# Patient Record
Sex: Male | Born: 1955 | Race: Black or African American | Hispanic: No | Marital: Married | Smoking: Never smoker
Health system: Southern US, Community
[De-identification: ages and names within clinical notes are randomized; demographics above are authoritative.]

## PROBLEM LIST (undated history)

## (undated) DIAGNOSIS — E782 Mixed hyperlipidemia: Secondary | ICD-10-CM

## (undated) DIAGNOSIS — G4733 Obstructive sleep apnea (adult) (pediatric): Secondary | ICD-10-CM

## (undated) DIAGNOSIS — B009 Herpesviral infection, unspecified: Secondary | ICD-10-CM

## (undated) DIAGNOSIS — M10372 Gout due to renal impairment, left ankle and foot: Secondary | ICD-10-CM

## (undated) DIAGNOSIS — R7302 Impaired glucose tolerance (oral): Secondary | ICD-10-CM

## (undated) DIAGNOSIS — C61 Malignant neoplasm of prostate: Secondary | ICD-10-CM

## (undated) DIAGNOSIS — I10 Essential (primary) hypertension: Secondary | ICD-10-CM

## (undated) HISTORY — DX: Essential (primary) hypertension: I10

## (undated) HISTORY — DX: Mixed hyperlipidemia: E78.2

## (undated) HISTORY — DX: Gout due to renal impairment, left ankle and foot: M10.372

## (undated) HISTORY — DX: Herpesviral infection, unspecified: B00.9

## (undated) HISTORY — DX: Malignant neoplasm of prostate: C61

## (undated) HISTORY — DX: Obstructive sleep apnea (adult) (pediatric): G47.33

## (undated) HISTORY — DX: Impaired glucose tolerance (oral): R73.02

---

## 2006-02-04 ENCOUNTER — Emergency Department: Payer: Self-pay | Admitting: Emergency Medicine

## 2007-05-25 ENCOUNTER — Ambulatory Visit: Payer: Self-pay | Admitting: Urology

## 2007-06-01 ENCOUNTER — Ambulatory Visit: Payer: Self-pay | Admitting: Urology

## 2009-08-19 ENCOUNTER — Emergency Department: Payer: Self-pay | Admitting: Emergency Medicine

## 2009-09-14 ENCOUNTER — Ambulatory Visit: Payer: Self-pay | Admitting: Family Medicine

## 2011-08-04 ENCOUNTER — Emergency Department: Payer: Self-pay | Admitting: Emergency Medicine

## 2011-09-02 ENCOUNTER — Emergency Department: Payer: Self-pay | Admitting: Emergency Medicine

## 2014-09-22 ENCOUNTER — Emergency Department: Payer: Self-pay | Admitting: Emergency Medicine

## 2018-02-09 ENCOUNTER — Emergency Department: Payer: Self-pay

## 2018-02-09 ENCOUNTER — Emergency Department
Admission: EM | Admit: 2018-02-09 | Discharge: 2018-02-09 | Disposition: A | Discharge status: Home / Self Care | Payer: Self-pay | Attending: Emergency Medicine | Admitting: Emergency Medicine

## 2018-02-09 ENCOUNTER — Encounter: Payer: Self-pay | Admitting: Emergency Medicine

## 2018-02-09 ENCOUNTER — Other Ambulatory Visit: Payer: Self-pay

## 2018-02-09 DIAGNOSIS — H9312 Tinnitus, left ear: Secondary | ICD-10-CM | POA: Insufficient documentation

## 2018-02-09 DIAGNOSIS — I10 Essential (primary) hypertension: Secondary | ICD-10-CM | POA: Insufficient documentation

## 2018-02-09 DIAGNOSIS — R42 Dizziness and giddiness: Secondary | ICD-10-CM | POA: Insufficient documentation

## 2018-02-09 DIAGNOSIS — H9319 Tinnitus, unspecified ear: Secondary | ICD-10-CM

## 2018-02-09 HISTORY — DX: Essential (primary) hypertension: I10

## 2018-02-09 LAB — URINALYSIS, COMPLETE (UACMP) WITH MICROSCOPIC
Bacteria, UA: NONE SEEN
Bilirubin Urine: NEGATIVE
GLUCOSE, UA: NEGATIVE mg/dL
HGB URINE DIPSTICK: NEGATIVE
Ketones, ur: NEGATIVE mg/dL
Leukocytes, UA: NEGATIVE
Nitrite: NEGATIVE
PH: 6 (ref 5.0–8.0)
Protein, ur: NEGATIVE mg/dL
SPECIFIC GRAVITY, URINE: 1.01 (ref 1.005–1.030)

## 2018-02-09 LAB — BASIC METABOLIC PANEL
Anion gap: 9 (ref 5–15)
BUN: 13 mg/dL (ref 8–23)
CALCIUM: 9 mg/dL (ref 8.9–10.3)
CO2: 28 mmol/L (ref 22–32)
CREATININE: 1.1 mg/dL (ref 0.61–1.24)
Chloride: 102 mmol/L (ref 98–111)
GFR calc non Af Amer: >60 mL/min (ref >60)
Glucose, Bld: 133 mg/dL — ABNORMAL HIGH (ref 70–99)
Potassium: 4.4 mmol/L (ref 3.5–5.1)
SODIUM: 139 mmol/L (ref 135–145)

## 2018-02-09 LAB — CBC
HCT: 45 % (ref 40.0–52.0)
Hemoglobin: 15.6 g/dL (ref 13.0–18.0)
MCH: 29.9 pg (ref 26.0–34.0)
MCHC: 34.6 g/dL (ref 32.0–36.0)
MCV: 86.4 fL (ref 80.0–100.0)
PLATELETS: 237 10*3/uL (ref 150–440)
RBC: 5.2 MIL/uL (ref 4.40–5.90)
RDW: 14.3 % (ref 11.5–14.5)
WBC: 7.6 10*3/uL (ref 3.8–10.6)

## 2018-02-09 LAB — TROPONIN I

## 2018-02-09 MED ORDER — BUTALBITAL-APAP-CAFFEINE 50-325-40 MG PO TABS
1.0000 | ORAL_TABLET | Freq: Four times a day (QID) | ORAL | 0 refills | Status: AC | PRN
Start: 2018-02-09 — End: 2019-02-09

## 2018-02-09 MED ORDER — SODIUM CHLORIDE 0.9 % IV SOLN
1000.0000 mL | Freq: Once | INTRAVENOUS | Status: AC
  Administered 2018-02-09: 1000 mL via INTRAVENOUS

## 2018-02-09 MED ORDER — ONDANSETRON HCL 4 MG/2ML IJ SOLN
4.0000 mg | Freq: Once | INTRAMUSCULAR | Status: DC
  Filled 2018-02-09: qty 2

## 2018-02-09 NOTE — ED Triage Notes (Addendum)
Pt arrived via POV with reports dizziness, ear ringing and difficulty ambulating, States sxs started last Wednesday, pt states the sxs are worse today.    Pt c/o headache that started last Wednesday. Pt also states his left arm has been tingling off and on since Wednesday as well.

## 2018-02-09 NOTE — ED Provider Notes (Signed)
[facility] Emergency Department Provider Note   ____________________________________________    I have reviewed the triage vital signs and the nursing notes.   HISTORY  Chief Complaint Dizziness; Tinnitus; and Headache     HPI Larry L Bondy Sr. is a 62 y.o. male who presents with multiple complaints.  Patient reports for the last 4 to 5 days he has had a global moderate headache, left-sided tinnitus, some "swimmy headedness "when he stands up.  He denies neuro deficits.  No fevers or chills but does report waking up having sweated through his shirt this morning.  Denies cough or shortness of breath.  No recent travel.  No calf pain or swelling.  No visual changes   Past Medical History:  Diagnosis Date  . Hypertension     There are no active problems to display for this patient.   History reviewed. No pertinent surgical history.  Prior to Admission medications   Medication Sig Start Date End Date Taking? Authorizing Provider  butalbital-acetaminophen-caffeine (FIORICET, ESGIC) [phone] MG tablet Take 1-2 tablets by mouth every 6 (six) hours as needed for headache. 02/09/18 02/09/19  Lavonia Drafts, MD     Allergies Patient has no known allergies.  No family history on file.  Social History Social History   Tobacco Use  . Smoking status: Never Smoker  . Smokeless tobacco: Never Used  Substance Use Topics  . Alcohol use: Not on file  . Drug use: Not on file    Review of Systems  Constitutional: As above Eyes: No visual changes.  ENT: No sore throat. Cardiovascular: Denies chest pain. Respiratory: Denies shortness of breath. Gastrointestinal: No abdominal pain.  No nausea, no vomiting.   Genitourinary: Negative for dysuria. Musculoskeletal: Negative for back pain. Skin: Negative for rash. Neurological: As above   ____________________________________________   PHYSICAL EXAM:  VITAL SIGNS: ED Triage Vitals  Enc Vitals Group      BP 02/09/18 0948 (!) 144/97     Pulse Rate 02/09/18 0948 76     Resp 02/09/18 0948 16     Temp 02/09/18 0948 99.2 F (37.3 C)     Temp Source 02/09/18 0948 Oral     SpO2 02/09/18 0948 96 %     Weight 02/09/18 0948 126.1 kg (278 lb)     Height 02/09/18 0948 1.676 m (5\' 6" )     Head Circumference --      Peak Flow --      Pain Score 02/09/18 1004 8     Pain Loc --      Pain Edu? --      Excl. in [place]? --     Constitutional: Alert and oriented. No acute distress.  Eyes: Conjunctivae are normal.  PERRLA Head: Atraumatic. Nose: No congestion/rhinnorhea. Mouth/Throat: Mucous membranes are moist.   Ears: Normal exam Cardiovascular: Normal rate, regular rhythm. Grossly normal heart sounds.  Good peripheral circulation. Respiratory: Normal respiratory effort.  No retractions.  Gastrointestinal: Soft and nontender. No distention.    Musculoskeletal: No lower extremity tenderness nor edema.  Warm and well perfused Neurologic:  Normal speech and language. No gross focal neurologic deficits are appreciated.  Skin:  Skin is warm, dry and intact. No rash noted. Psychiatric: Mood and affect are normal. Speech and behavior are normal.  ____________________________________________   LABS (all labs ordered are listed, but only abnormal results are displayed)  Labs Reviewed  BASIC METABOLIC PANEL - Abnormal; Notable for the following components:  Result Value   Glucose, Bld 133 (*)    All other components within normal limits  URINALYSIS, COMPLETE (UACMP) WITH MICROSCOPIC - Abnormal; Notable for the following components:   Color, Urine YELLOW (*)    APPearance CLEAR (*)    All other components within normal limits  CBC  TROPONIN I  CBG MONITORING, ED   ____________________________________________  EKG  ED ECG REPORT I, Lavonia Drafts, the attending physician, personally viewed and interpreted this ECG.  Date: 02/09/2018  Rhythm: normal sinus rhythm QRS Axis:  normal Intervals: normal ST/T Wave abnormalities: normal Narrative Interpretation: no evidence of acute ischemia  ____________________________________________  RADIOLOGY  CT head and chest x-ray unremarkable ____________________________________________   PROCEDURES  Procedure(s) performed: No  Procedures   Critical Care performed:No ____________________________________________   INITIAL IMPRESSION / ASSESSMENT AND PLAN / ED COURSE  Pertinent labs & imaging results that were available during my care of the patient were reviewed by me and considered in my medical decision making (see chart for details).  Patient presents with headache x4 to 5 days, with left-sided tinnitus, some vertiginous-like symptoms.  We will treat with IV fluids, IV Zofran, obtain CT imaging, chest x-ray given description of soaked T-shirt this morning  Patient's lab work is unremarkable, imaging is normal.  He felt significant better after IV fluids and IV Zofran.  Headache has resolved.  Will discharge with outpatient follow-up, Fioricet as needed.  Return precautions discussed      ____________________________________________   FINAL CLINICAL IMPRESSION(S) / ED DIAGNOSES  Final diagnoses:  Dizziness  Tinnitus, unspecified laterality        Note:  This document was prepared using Dragon voice recognition software and may include unintentional dictation errors.      Lavonia Drafts, MD 02/09/18 [phone]

## 2018-02-09 NOTE — ED Notes (Addendum)
First Nurse Note: Patient complaining of dizziness X 2 days and ringing in left ear. Placed in [place].

## 2018-02-09 NOTE — ED Notes (Signed)
Dr. Corky Downs aware of pt BP and states to continue with discharge at this time.

## 2018-03-11 ENCOUNTER — Emergency Department
Admission: EM | Admit: 2018-03-11 | Discharge: 2018-03-11 | Disposition: A | Discharge status: Home / Self Care | Payer: Self-pay | Attending: Emergency Medicine | Admitting: Emergency Medicine

## 2018-03-11 ENCOUNTER — Other Ambulatory Visit: Payer: Self-pay

## 2018-03-11 DIAGNOSIS — I10 Essential (primary) hypertension: Secondary | ICD-10-CM | POA: Insufficient documentation

## 2018-03-11 DIAGNOSIS — L02412 Cutaneous abscess of left axilla: Secondary | ICD-10-CM | POA: Insufficient documentation

## 2018-03-11 MED ORDER — HYDROCODONE-ACETAMINOPHEN 5-325 MG PO TABS: 1.0000 | ORAL_TABLET | Freq: Four times a day (QID) | ORAL | 0 refills | Status: DC | PRN

## 2018-03-11 MED ORDER — SULFAMETHOXAZOLE-TRIMETHOPRIM 800-160 MG PO TABS: 1.0000 | ORAL_TABLET | Freq: Two times a day (BID) | ORAL | 0 refills | Status: DC

## 2018-03-11 MED ORDER — LIDOCAINE HCL (PF) 1 % IJ SOLN
5.0000 mL | Freq: Once | INTRAMUSCULAR | Status: AC
  Administered 2018-03-11: 5 mL
  Filled 2018-03-11: qty 5

## 2018-03-11 NOTE — Discharge Instructions (Signed)
Return to the emergency department in 2 days for packing removal.  Begin taking antibiotics as directed twice a day for the next 10 days.  Norco every 6 hours as needed for moderate pain.  Do not drive or operate machinery while taking this medication as it could cause drowsiness.  You should then follow-up with your primary care provider at [facility] if any continued problems.

## 2018-03-11 NOTE — ED Notes (Signed)
See triage note.

## 2018-03-11 NOTE — ED Provider Notes (Signed)
[facility] Emergency Department Provider Note  ____________________________________________   None    (approximate)  I have reviewed the triage vital signs and the nursing notes.   HISTORY  Chief Complaint Abscess   HPI Larry L Shenberger Sr. is a 62 y.o. male to the emergency department with complaint of abscess to his left arm.  Patient states that abscess started approximately 3 days ago.  He is used home remedies without any relief.  Area has not drained any.  He is unaware of any fever or chills.  He states he has had abscesses in the past.  Currently rates his pain as 10/10.  Past Medical History:  Diagnosis Date  . Hypertension     There are no active problems to display for this patient.   History reviewed. No pertinent surgical history.  Prior to Admission medications   Medication Sig Start Date End Date Taking? Authorizing Provider  butalbital-acetaminophen-caffeine (FIORICET, ESGIC) [phone] MG tablet Take 1-2 tablets by mouth every 6 (six) hours as needed for headache. 02/09/18 02/09/19  Lavonia Drafts, MD  HYDROcodone-acetaminophen (NORCO/VICODIN) 5-325 MG tablet Take 1 tablet by mouth every 6 (six) hours as needed for moderate pain. 03/11/18   Johnn Hai, PA-C  sulfamethoxazole-trimethoprim (BACTRIM DS,SEPTRA DS) 800-160 MG tablet Take 1 tablet by mouth 2 (two) times daily. 03/11/18   Johnn Hai, PA-C    Allergies Patient has no known allergies.  No family history on file.  Social History Social History   Tobacco Use  . Smoking status: Never Smoker  . Smokeless tobacco: Never Used  Substance Use Topics  . Alcohol use: Never    Frequency: Never  . Drug use: Never    Review of Systems Constitutional: No fever/chills Cardiovascular: Denies chest pain. Respiratory: Denies shortness of breath. Musculoskeletal: Negative for muscle skeletal pain. Skin: Positive for abscess. Neurological: Negative for headaches, focal  weakness or numbness. ____________________________________________   PHYSICAL EXAM:  VITAL SIGNS: ED Triage Vitals [03/11/18 0825]  Enc Vitals Group     BP (!) 153/82     Pulse Rate 68     Resp 18     Temp 98.4 F (36.9 C)     Temp Source Oral     SpO2 97 %     Weight 280 lb (127 kg)     Height 5\' 6"  (1.676 m)     Head Circumference      Peak Flow      Pain Score 10     Pain Loc      Pain Edu?      Excl. in [place]?    Constitutional: Alert and oriented. Well appearing and in no acute distress. Eyes: Conjunctivae are normal.  Head: Atraumatic. Neck: No stridor.   Cardiovascular: Normal rate, regular rhythm. Grossly normal heart sounds.  Good peripheral circulation. Respiratory: Normal respiratory effort.  No retractions. Lungs CTAB. Musculoskeletal: Moves upper and lower extremities without any difficulty however movement of the left arm is guarded secondary to pain.  Abscess is present left arm proximal aspect.  Erythematous and moderate tenderness to palpation.  No surrounding cellulitis is present. Neurologic:  Normal speech and language. No gross focal neurologic deficits are appreciated. No gait instability. Skin:  Skin is warm, dry and intact.  Erythematous left axilla area as noted above. Psychiatric: Mood and affect are normal. Speech and behavior are normal.  ____________________________________________   LABS (all labs ordered are listed, but only abnormal results are displayed)  Labs Reviewed -  No data to display ________________________________________   PROCEDURES  Procedure(s) performed:   Marland KitchenMarland KitchenIncision and Drainage Date/Time: 03/11/2018 11:06 AM Performed by: Johnn Hai, PA-C Authorized by: Johnn Hai, PA-C   Consent:    Consent obtained:  Verbal   Consent given by:  Patient   Risks discussed:  Incomplete drainage Location:    Type:  Abscess   Location:  Upper extremity   Upper extremity location:  Arm   Arm location:  L upper  arm Pre-procedure details:    Skin preparation:  Antiseptic wash Anesthesia (see MAR for exact dosages):    Anesthesia method:  Local infiltration   Local anesthetic:  Lidocaine 1% w/o epi Procedure type:    Complexity:  Simple Procedure details:    Needle aspiration: no     Incision types:  Stab incision   Scalpel blade:  11   Wound management:  Probed and deloculated   Drainage:  Purulent and bloody   Drainage amount:  Copious   Wound treatment:  Drain placed   Packing materials:  1/2 in iodoform gauze Post-procedure details:    Patient tolerance of procedure:  Tolerated well, no immediate complications    Critical Care performed: No  ____________________________________________   INITIAL IMPRESSION / ASSESSMENT AND PLAN / ED COURSE  As part of my medical decision making, I reviewed the following data within the electronic MEDICAL RECORD NUMBER Notes from prior ED visits and [place] Controlled Substance Database  Presents to the emergency department with complaint of abscess to the left upper arm.  Area was opened and packed with iodoform gauze.  He tolerated the procedure well.  He is to begin taking Bactrim DS twice daily for 10 days.  Is also given a prescription for Norco as needed for pain.  He is aware that he cannot drive or operate machinery while taking this medication.  He is to return to the emergency department in 2 days for packing removal.  He will follow-up with his PCP if any continued problems. ____________________________________________   FINAL CLINICAL IMPRESSION(S) / ED DIAGNOSES  Final diagnoses:  Abscess of left axilla     ED Discharge Orders         Ordered    sulfamethoxazole-trimethoprim (BACTRIM DS,SEPTRA DS) 800-160 MG tablet  2 times daily     03/11/18 1055    HYDROcodone-acetaminophen (NORCO/VICODIN) 5-325 MG tablet  Every 6 hours PRN     03/11/18 1055           Note:  This document was prepared using Dragon voice recognition software and  may include unintentional dictation errors.    Johnn Hai, PA-C 03/11/18 [place], [place], MD 03/11/18 [phone]

## 2018-03-11 NOTE — ED Notes (Signed)
Dressing applied to I&D abscess on left under arm

## 2018-03-11 NOTE — ED Triage Notes (Signed)
Pt has abscess located under left arm - the abscess has been present for 2-3 days with no relief from home remedies - area is not draining per pt

## 2018-03-13 ENCOUNTER — Other Ambulatory Visit: Payer: Self-pay

## 2018-03-13 ENCOUNTER — Emergency Department
Admission: EM | Admit: 2018-03-13 | Discharge: 2018-03-13 | Disposition: A | Discharge status: Home / Self Care | Payer: Self-pay | Attending: Emergency Medicine | Admitting: Emergency Medicine

## 2018-03-13 DIAGNOSIS — Z5189 Encounter for other specified aftercare: Secondary | ICD-10-CM

## 2018-03-13 DIAGNOSIS — I10 Essential (primary) hypertension: Secondary | ICD-10-CM | POA: Insufficient documentation

## 2018-03-13 DIAGNOSIS — L02412 Cutaneous abscess of left axilla: Secondary | ICD-10-CM | POA: Insufficient documentation

## 2018-03-13 DIAGNOSIS — Z79899 Other long term (current) drug therapy: Secondary | ICD-10-CM | POA: Insufficient documentation

## 2018-03-13 NOTE — ED Provider Notes (Signed)
[facility] EMERGENCY DEPARTMENT Provider Note   CSN: [id number] Arrival date & time: 03/13/18  1312     History   Chief Complaint Chief Complaint  Patient presents with  . Wound Check    HPI Larry L Beel Sr. is a 62 y.o. male presents to the emergency department for recheck of his left axilla abscess.  Patient began developing abscess with pain and swelling on 03/08/2018.  Patient was seen in the emergency department had incision and drainage of left arm axillary region abscess on 03/11/2018.  Purulent drainage was expressed, iodoform packing was applied.  Today patient states he is doing much better.  He has been taking Bactrim.  Denies any fevers.  Pain and swelling is 75% better.  No numbness or tingling throughout the left upper extremity. HPI  Past Medical History:  Diagnosis Date  . Hypertension     There are no active problems to display for this patient.   History reviewed. No pertinent surgical history.      Home Medications    Prior to Admission medications   Medication Sig Start Date End Date Taking? Authorizing Provider  butalbital-acetaminophen-caffeine (FIORICET, ESGIC) [phone] MG tablet Take 1-2 tablets by mouth every 6 (six) hours as needed for headache. 02/09/18 02/09/19  Lavonia Drafts, MD  HYDROcodone-acetaminophen (NORCO/VICODIN) 5-325 MG tablet Take 1 tablet by mouth every 6 (six) hours as needed for moderate pain. 03/11/18   Johnn Hai, PA-C  sulfamethoxazole-trimethoprim (BACTRIM DS,SEPTRA DS) 800-160 MG tablet Take 1 tablet by mouth 2 (two) times daily. 03/11/18   Johnn Hai, PA-C    Family History No family history on file.  Social History Social History   Tobacco Use  . Smoking status: Never Smoker  . Smokeless tobacco: Never Used  Substance Use Topics  . Alcohol use: Never    Frequency: Never  . Drug use: Never     Allergies   Patient has no known allergies.   Review of Systems Review of Systems    Constitutional: Negative for chills and fever.  Musculoskeletal: Negative for arthralgias, joint swelling, myalgias and neck pain.  Skin: Positive for wound. Negative for color change and rash.  Neurological: Negative for numbness.     Physical Exam Updated Vital Signs BP (!) 152/80 (BP Location: Right Arm)   Pulse 68   Temp 98.6 F (37 C) (Oral)   Resp 16   Ht 5\' 6"  (1.676 m)   Wt 126 kg   SpO2 97%   BMI 44.83 kg/m   Physical Exam  Constitutional: He is oriented to person, place, and time. He appears well-developed and well-nourished.  HENT:  Head: Normocephalic and atraumatic.  Eyes: Conjunctivae are normal.  Neck: Normal range of motion.  Cardiovascular: Normal rate.  Pulmonary/Chest: Effort normal. No respiratory distress.  Musculoskeletal: Normal range of motion.  Left inner aspect of the proximal humerus along the axillary region shows a 4 x 4 centimeter area of minimal soft tissue swelling with a 1 cm opening with iodoform packing present.  Iodoform packing removed with minimal purulent drainage still present.  There is no significant induration, cellulitis present.   Neurological: He is alert and oriented to person, place, and time.  Skin: Skin is warm. No rash noted.  Psychiatric: He has a normal mood and affect. His behavior is normal. Thought content normal.     ED Treatments / Results  Labs (all labs ordered are listed, but only abnormal results are displayed) Labs Reviewed - No  data to display  EKG None  Radiology No results found.  Procedures .Marland KitchenIncision and Drainage Date/Time: 03/13/2018 1:52 PM Performed by: Larry Guess, PA-C Authorized by: Larry Guess, PA-C   Consent:    Consent obtained:  Verbal   Consent given by:  Patient Comments:     Iodoform packing removed, new quarter inch iodoform packing placed into the abscess cavity.  Minimal purulent drainage noted.  No induration.   (including critical care time)  Medications Ordered  in ED Medications - No data to display   Initial Impression / Assessment and Plan / ED Course  I have reviewed the triage vital signs and the nursing notes.  Pertinent labs & imaging results that were available during my care of the patient were reviewed by me and considered in my medical decision making (see chart for details).     62 year old man presents today for wound check from left arm incision and drainage with iodoform packing 2 days ago.  Patient is doing significantly better in regards to pain, swelling.  New iodoform packing applied he will continue with antibiotics and follow-up 2 to 3 days for packing removal and wound check. Final Clinical Impressions(s) / ED Diagnoses   Final diagnoses:  Wound check, abscess    ED Discharge Orders    None       Larry Guess, PA-C 03/13/18 1356    Delman Kitten, MD 03/15/18 1545

## 2018-03-13 NOTE — Discharge Instructions (Addendum)
Please follow-up with primary care provider or emergency department in 2 to 3 days for wound check, packing removal.  Please continue with antibiotics.  You may change dressing daily.  He may shower but do not submerge underwater.

## 2018-03-13 NOTE — ED Triage Notes (Signed)
Pt states he is here for wound check of the left upper arm. States it is feeling better.

## 2018-03-13 NOTE — ED Notes (Signed)
See triage note  Here for wound check  States he feels better

## 2019-08-03 IMAGING — CT CT HEAD W/O CM
3 series · 15 of 47 positions shown, 18 images · non-contrast
Comparison: None.

CLINICAL DATA: Dizziness, ear ringing and difficulty ambulating.
Symptom onset on [REDACTED], worsening today. Headache starting last
[REDACTED]. LEFT arm tingling, intermittent since [REDACTED].

EXAM:
CT HEAD WITHOUT CONTRAST
TECHNIQUE: Contiguous axial images were obtained from the base of the skull
through the vertex without intravenous contrast.

[Series 2: head wo · axial · 0.40mm/px · z∈[+467,+592]mm · 9 of 30 slices shown, 12 images]
[im 3/30  brain]
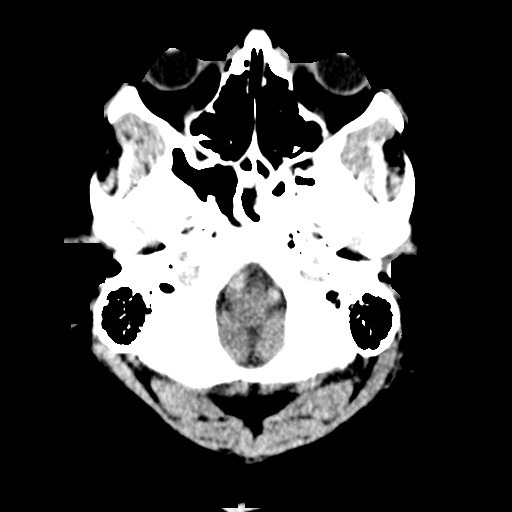
[im 3/30  bone]
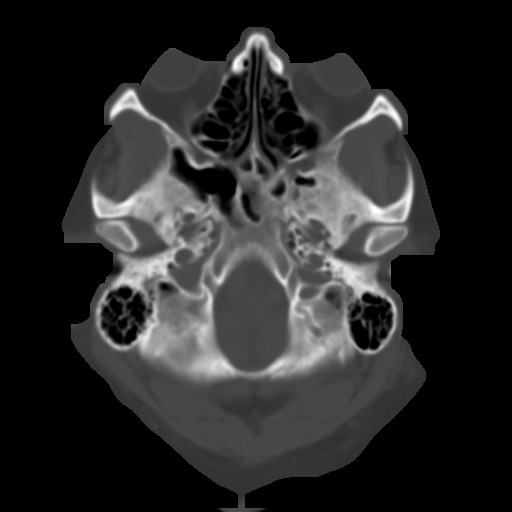
[im 6/30  brain]
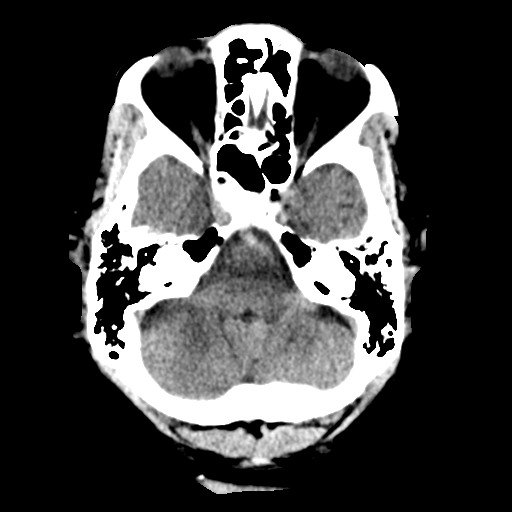
[im 9/30  brain]
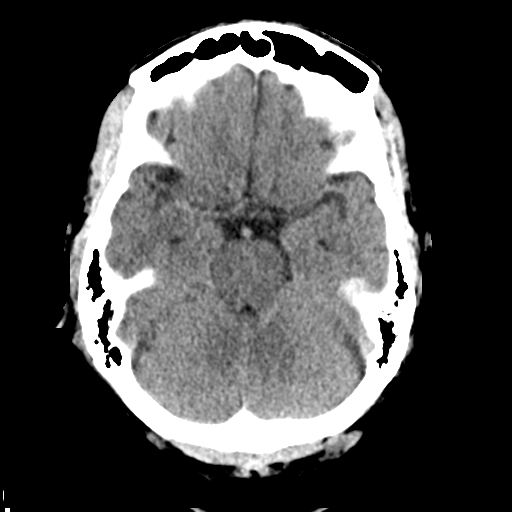
[im 12/30  brain]
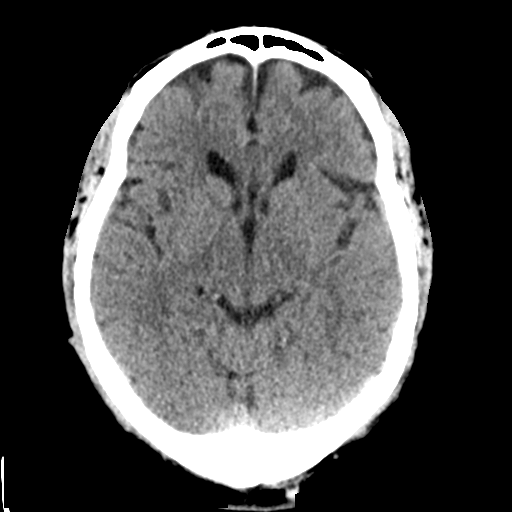
[im 16/30  brain]
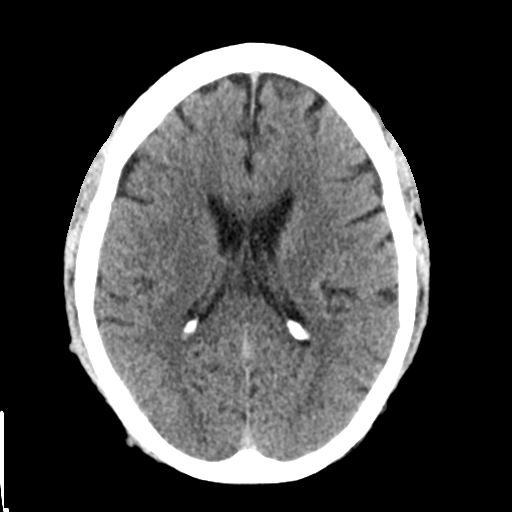
[im 16/30  bone]
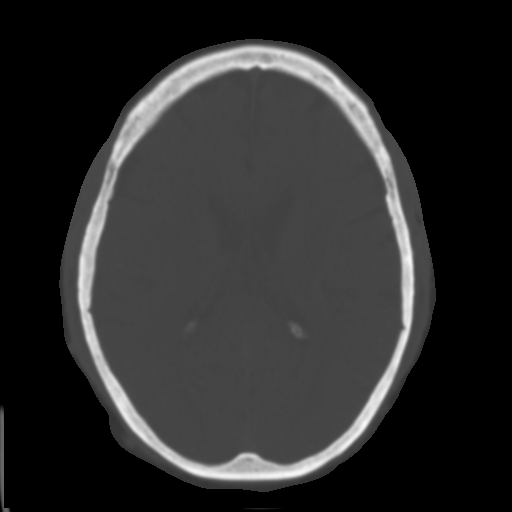
[im 19/30  brain]
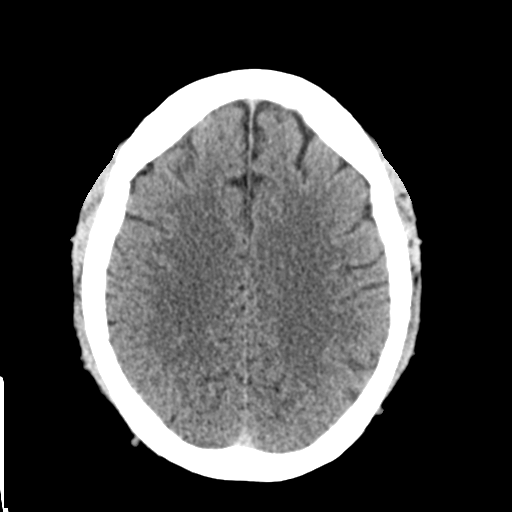
[im 22/30  brain]
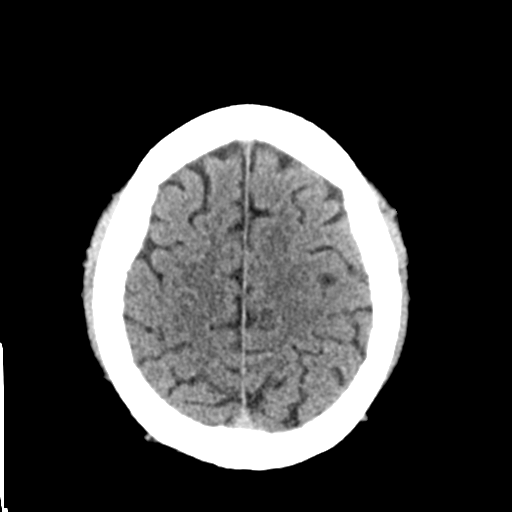
[im 25/30  brain]
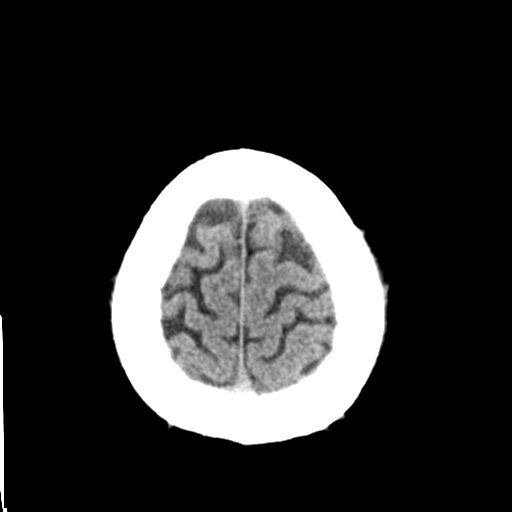
[im 28/30  brain]
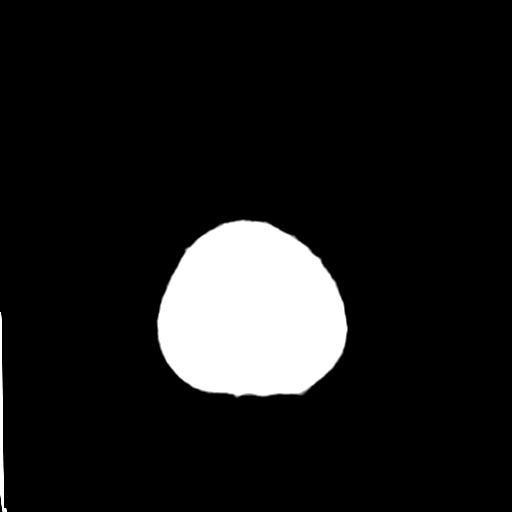
[im 28/30  bone]
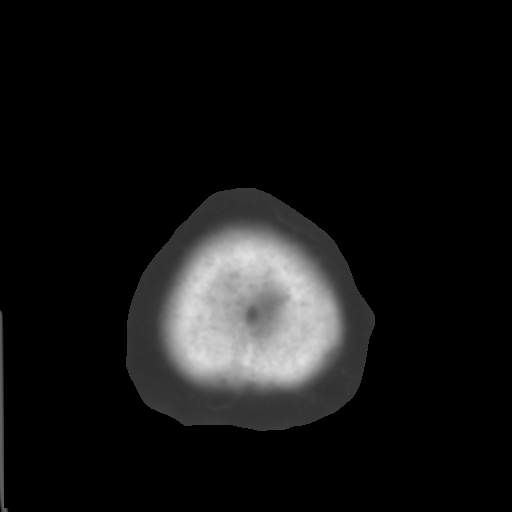

[Series 4: coronal soft tissue · coronal · 0.30mm/px · 3 of 67 slices shown]
[im 23/67  brain]
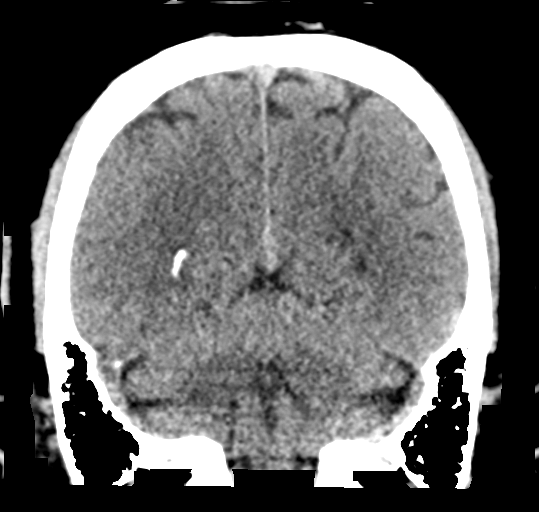
[im 30/67  brain]
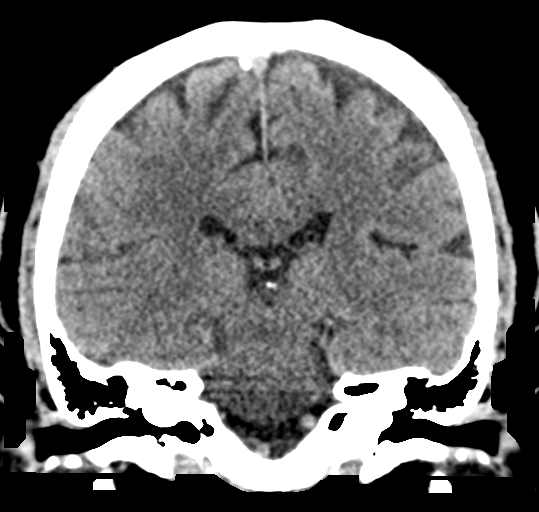
[im 37/67  brain]
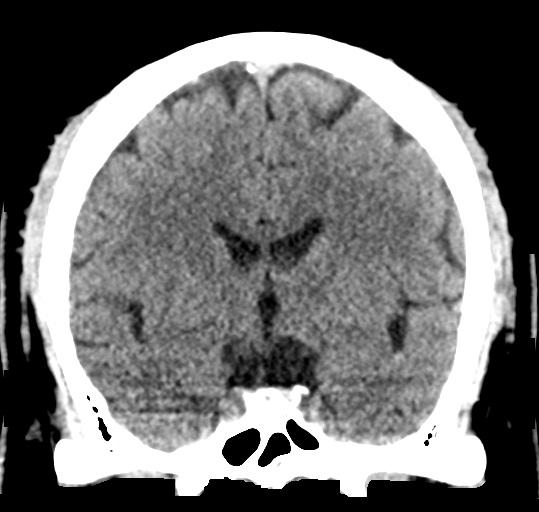

[Series 5: sagittal soft tissue · sagittal · 0.30mm/px · 3 of 55 slices shown]
[im 19/55  brain]
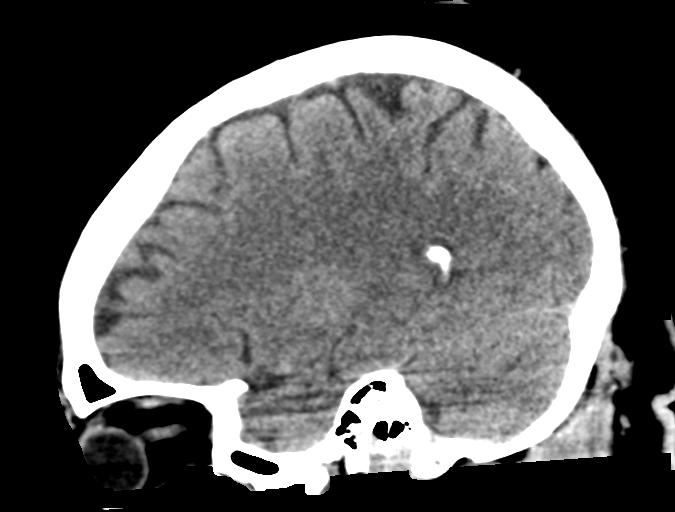
[im 28/55  brain]
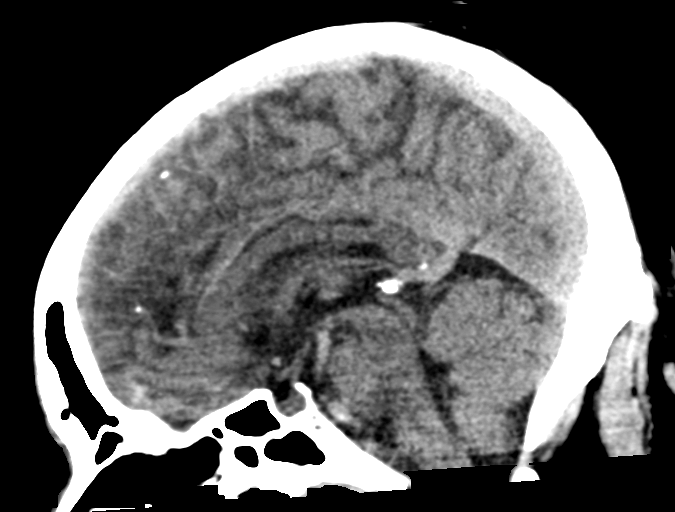
[im 37/55  brain]
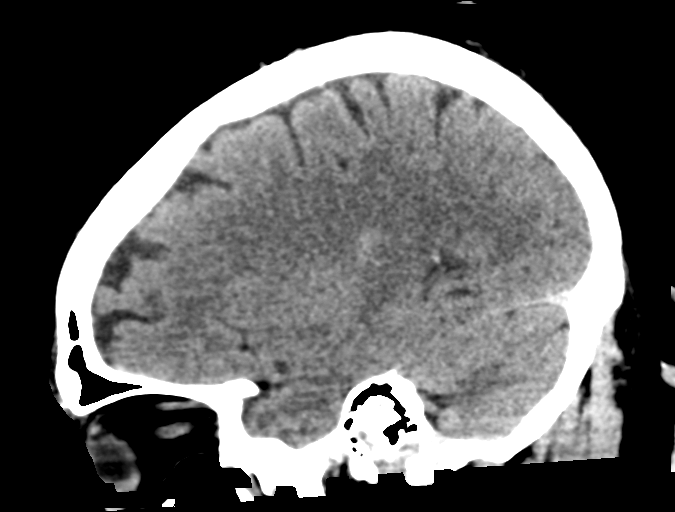

[15 of 47 positions shown; findings below may reference images not displayed]

FINDINGS: Brain: Ventricles are within normal limits in size and
configuration. All areas of the brain demonstrate appropriate
gray-white matter differentiation. There is no mass, hemorrhage,
edema or other evidence of acute parenchymal abnormality. No
extra-axial hemorrhage.

Vascular: No hyperdense vessel or unexpected calcification.

Skull: Normal. Negative for fracture or focal lesion.

Sinuses/Orbits: No acute finding.

Other: None.
IMPRESSION: Negative head CT.  No intracranial mass, hemorrhage or edema.

## 2021-05-28 DIAGNOSIS — G4733 Obstructive sleep apnea (adult) (pediatric): Secondary | ICD-10-CM | POA: Diagnosis not present

## 2021-05-28 DIAGNOSIS — E782 Mixed hyperlipidemia: Secondary | ICD-10-CM | POA: Diagnosis not present

## 2021-05-28 DIAGNOSIS — Z1503 Genetic susceptibility to malignant neoplasm of prostate: Secondary | ICD-10-CM | POA: Diagnosis not present

## 2021-05-28 DIAGNOSIS — I10 Essential (primary) hypertension: Secondary | ICD-10-CM | POA: Diagnosis not present

## 2021-05-28 DIAGNOSIS — E663 Overweight: Secondary | ICD-10-CM | POA: Diagnosis not present

## 2021-06-04 DIAGNOSIS — E782 Mixed hyperlipidemia: Secondary | ICD-10-CM | POA: Diagnosis not present

## 2021-06-04 DIAGNOSIS — I10 Essential (primary) hypertension: Secondary | ICD-10-CM | POA: Diagnosis not present

## 2021-06-04 DIAGNOSIS — R7302 Impaired glucose tolerance (oral): Secondary | ICD-10-CM | POA: Diagnosis not present

## 2021-06-04 DIAGNOSIS — Z6841 Body Mass Index (BMI) 40.0 and over, adult: Secondary | ICD-10-CM | POA: Diagnosis not present

## 2021-06-04 DIAGNOSIS — G4733 Obstructive sleep apnea (adult) (pediatric): Secondary | ICD-10-CM | POA: Diagnosis not present

## 2021-06-11 DIAGNOSIS — Z6841 Body Mass Index (BMI) 40.0 and over, adult: Secondary | ICD-10-CM | POA: Diagnosis not present

## 2021-06-11 DIAGNOSIS — G4733 Obstructive sleep apnea (adult) (pediatric): Secondary | ICD-10-CM | POA: Diagnosis not present

## 2021-06-11 DIAGNOSIS — E782 Mixed hyperlipidemia: Secondary | ICD-10-CM | POA: Diagnosis not present

## 2021-06-11 DIAGNOSIS — I10 Essential (primary) hypertension: Secondary | ICD-10-CM | POA: Diagnosis not present

## 2021-06-11 DIAGNOSIS — R7302 Impaired glucose tolerance (oral): Secondary | ICD-10-CM | POA: Diagnosis not present

## 2021-06-22 DIAGNOSIS — G4733 Obstructive sleep apnea (adult) (pediatric): Secondary | ICD-10-CM | POA: Diagnosis not present

## 2021-07-06 DIAGNOSIS — Z6841 Body Mass Index (BMI) 40.0 and over, adult: Secondary | ICD-10-CM | POA: Diagnosis not present

## 2021-07-06 DIAGNOSIS — M109 Gout, unspecified: Secondary | ICD-10-CM | POA: Diagnosis not present

## 2021-07-23 DIAGNOSIS — M109 Gout, unspecified: Secondary | ICD-10-CM | POA: Diagnosis not present

## 2021-07-23 DIAGNOSIS — R7302 Impaired glucose tolerance (oral): Secondary | ICD-10-CM | POA: Diagnosis not present

## 2021-07-23 DIAGNOSIS — G4733 Obstructive sleep apnea (adult) (pediatric): Secondary | ICD-10-CM | POA: Diagnosis not present

## 2021-07-23 DIAGNOSIS — E782 Mixed hyperlipidemia: Secondary | ICD-10-CM | POA: Diagnosis not present

## 2021-07-23 DIAGNOSIS — I10 Essential (primary) hypertension: Secondary | ICD-10-CM | POA: Diagnosis not present

## 2021-08-02 DIAGNOSIS — M109 Gout, unspecified: Secondary | ICD-10-CM | POA: Diagnosis not present

## 2021-09-21 DIAGNOSIS — M109 Gout, unspecified: Secondary | ICD-10-CM | POA: Diagnosis not present

## 2021-09-21 DIAGNOSIS — R7302 Impaired glucose tolerance (oral): Secondary | ICD-10-CM | POA: Diagnosis not present

## 2021-09-21 DIAGNOSIS — G4733 Obstructive sleep apnea (adult) (pediatric): Secondary | ICD-10-CM | POA: Diagnosis not present

## 2021-09-21 DIAGNOSIS — E782 Mixed hyperlipidemia: Secondary | ICD-10-CM | POA: Diagnosis not present

## 2021-09-21 DIAGNOSIS — I10 Essential (primary) hypertension: Secondary | ICD-10-CM | POA: Diagnosis not present

## 2021-12-25 DIAGNOSIS — M109 Gout, unspecified: Secondary | ICD-10-CM | POA: Diagnosis not present

## 2021-12-25 DIAGNOSIS — E782 Mixed hyperlipidemia: Secondary | ICD-10-CM | POA: Diagnosis not present

## 2021-12-25 DIAGNOSIS — I10 Essential (primary) hypertension: Secondary | ICD-10-CM | POA: Diagnosis not present

## 2021-12-25 DIAGNOSIS — G4733 Obstructive sleep apnea (adult) (pediatric): Secondary | ICD-10-CM | POA: Diagnosis not present

## 2022-01-03 DIAGNOSIS — U071 COVID-19: Secondary | ICD-10-CM | POA: Diagnosis not present

## 2022-01-03 DIAGNOSIS — Z20822 Contact with and (suspected) exposure to covid-19: Secondary | ICD-10-CM | POA: Diagnosis not present

## 2022-03-07 DIAGNOSIS — E782 Mixed hyperlipidemia: Secondary | ICD-10-CM | POA: Diagnosis not present

## 2022-03-07 DIAGNOSIS — A499 Bacterial infection, unspecified: Secondary | ICD-10-CM | POA: Diagnosis not present

## 2022-03-07 DIAGNOSIS — I10 Essential (primary) hypertension: Secondary | ICD-10-CM | POA: Diagnosis not present

## 2022-03-07 DIAGNOSIS — L03012 Cellulitis of left finger: Secondary | ICD-10-CM | POA: Diagnosis not present

## 2022-03-12 DIAGNOSIS — I10 Essential (primary) hypertension: Secondary | ICD-10-CM | POA: Diagnosis not present

## 2022-03-12 DIAGNOSIS — L03012 Cellulitis of left finger: Secondary | ICD-10-CM | POA: Diagnosis not present

## 2022-03-12 DIAGNOSIS — E782 Mixed hyperlipidemia: Secondary | ICD-10-CM | POA: Diagnosis not present

## 2022-04-29 DIAGNOSIS — R7302 Impaired glucose tolerance (oral): Secondary | ICD-10-CM | POA: Diagnosis not present

## 2022-04-29 DIAGNOSIS — Z23 Encounter for immunization: Secondary | ICD-10-CM | POA: Diagnosis not present

## 2022-04-29 DIAGNOSIS — E782 Mixed hyperlipidemia: Secondary | ICD-10-CM | POA: Diagnosis not present

## 2022-04-29 DIAGNOSIS — I10 Essential (primary) hypertension: Secondary | ICD-10-CM | POA: Diagnosis not present

## 2022-04-29 DIAGNOSIS — M109 Gout, unspecified: Secondary | ICD-10-CM | POA: Diagnosis not present

## 2022-04-29 DIAGNOSIS — Z0000 Encounter for general adult medical examination without abnormal findings: Secondary | ICD-10-CM | POA: Diagnosis not present

## 2022-04-29 DIAGNOSIS — N400 Enlarged prostate without lower urinary tract symptoms: Secondary | ICD-10-CM | POA: Diagnosis not present

## 2022-04-29 DIAGNOSIS — G4733 Obstructive sleep apnea (adult) (pediatric): Secondary | ICD-10-CM | POA: Diagnosis not present

## 2022-06-20 ENCOUNTER — Other Ambulatory Visit: Payer: Self-pay

## 2022-06-20 ENCOUNTER — Emergency Department: Payer: Medicare HMO

## 2022-06-20 ENCOUNTER — Emergency Department
Admission: EM | Admit: 2022-06-20 | Discharge: 2022-06-20 | Disposition: A | Discharge status: Home / Self Care | Payer: Medicare HMO | Attending: Student in an Organized Health Care Education/Training Program | Admitting: Student in an Organized Health Care Education/Training Program

## 2022-06-20 DIAGNOSIS — I159 Secondary hypertension, unspecified: Secondary | ICD-10-CM | POA: Insufficient documentation

## 2022-06-20 DIAGNOSIS — I10 Essential (primary) hypertension: Secondary | ICD-10-CM | POA: Insufficient documentation

## 2022-06-20 DIAGNOSIS — R079 Chest pain, unspecified: Secondary | ICD-10-CM | POA: Diagnosis not present

## 2022-06-20 DIAGNOSIS — H9312 Tinnitus, left ear: Secondary | ICD-10-CM | POA: Diagnosis not present

## 2022-06-20 DIAGNOSIS — R519 Headache, unspecified: Secondary | ICD-10-CM | POA: Diagnosis not present

## 2022-06-20 LAB — CBC WITH DIFFERENTIAL/PLATELET
Abs Immature Granulocytes: 0.02 10*3/uL (ref 0.00–0.07)
Basophils Absolute: 0 10*3/uL (ref 0.0–0.1)
Basophils Relative: 0 %
Eosinophils Absolute: 0 10*3/uL (ref 0.0–0.5)
Eosinophils Relative: 1 %
HCT: 44.9 % (ref 39.0–52.0)
Hemoglobin: 14.8 g/dL (ref 13.0–17.0)
Immature Granulocytes: 0 %
Lymphocytes Relative: 29 %
Lymphs Abs: 1.9 10*3/uL (ref 0.7–4.0)
MCH: 29.4 pg (ref 26.0–34.0)
MCHC: 33 g/dL (ref 30.0–36.0)
MCV: 89.1 fL (ref 80.0–100.0)
Monocytes Absolute: 0.5 10*3/uL (ref 0.1–1.0)
Monocytes Relative: 8 %
Neutro Abs: 4 10*3/uL (ref 1.7–7.7)
Neutrophils Relative %: 62 %
Platelets: 233 10*3/uL (ref 150–400)
RBC: 5.04 MIL/uL (ref 4.22–5.81)
RDW: 12.9 % (ref 11.5–15.5)
WBC: 6.4 10*3/uL (ref 4.0–10.5)
nRBC: 0 % (ref 0.0–0.2)

## 2022-06-20 LAB — COMPREHENSIVE METABOLIC PANEL
ALT: 18 U/L (ref 0–44)
AST: 29 U/L (ref 15–41)
Albumin: 4.1 g/dL (ref 3.5–5.0)
Alkaline Phosphatase: 41 U/L (ref 38–126)
Anion gap: 4 — ABNORMAL LOW (ref 5–15)
BUN: 12 mg/dL (ref 8–23)
CO2: 26 mmol/L (ref 22–32)
Calcium: 8.8 mg/dL — ABNORMAL LOW (ref 8.9–10.3)
Chloride: 108 mmol/L (ref 98–111)
Creatinine, Ser: 0.86 mg/dL (ref 0.61–1.24)
GFR, Estimated: >60 mL/min (ref >60)
Glucose, Bld: 171 mg/dL — ABNORMAL HIGH (ref 70–99)
Potassium: 3.2 mmol/L — ABNORMAL LOW (ref 3.5–5.1)
Sodium: 138 mmol/L (ref 135–145)
Total Bilirubin: 1 mg/dL (ref 0.3–1.2)
Total Protein: 7.1 g/dL (ref 6.5–8.1)

## 2022-06-20 LAB — TROPONIN I (HIGH SENSITIVITY)
Troponin I (High Sensitivity): 9 ng/L (ref <18)
Troponin I (High Sensitivity): 11 ng/L (ref <18)

## 2022-06-20 MED ORDER — POTASSIUM CHLORIDE CRYS ER 20 MEQ PO TBCR
40.0000 meq | EXTENDED_RELEASE_TABLET | Freq: Once | ORAL | Status: AC
  Administered 2022-06-20: 40 meq via ORAL
  Filled 2022-06-20: qty 2

## 2022-06-20 MED ORDER — CLONIDINE HCL 0.1 MG PO TABS
0.1000 mg | ORAL_TABLET | Freq: Once | ORAL | Status: DC
  Filled 2022-06-20: qty 1

## 2022-06-20 MED ORDER — ACETAMINOPHEN 325 MG PO TABS
650.0000 mg | ORAL_TABLET | Freq: Once | ORAL | Status: AC
  Administered 2022-06-20: 650 mg via ORAL
  Filled 2022-06-20: qty 2

## 2022-06-20 NOTE — ED Notes (Signed)
Pt feeling better. Denies dizziness at this time. Is able to ambulate to the bathroom with a steady gait. BP stable. HR 51. MD is aware and wants to see what second trop says. No other intervention at this time.

## 2022-06-20 NOTE — ED Provider Notes (Signed)
[facility] Provider Note    Event Date/Time   First MD Initiated Contact with Patient 06/20/22 1322     (approximate)   History   Tinnitus   HPI  Larry Sutton is a 66 y.o. male presents to the ER for evaluation of left-sided chest discomfort as well as some ringing in his ears.  Symptoms have been ongoing for couple days.  Also concerned that his blood pressure has been elevated.  He denies any numbness or tingling.  Has a mild headache.  Not the worst headache of his life.  No blurry vision.  Will have symptoms like this from time to time particularly in times of stress.  Denies any abdominal pain.  No dysuria.  No diarrhea.     Physical Exam   Triage Vital Signs: ED Triage Vitals  Enc Vitals Group     BP 06/20/22 1247 (!) 153/93     Pulse Rate 06/20/22 1247 (!) 59     Resp 06/20/22 1247 18     Temp 06/20/22 1247 98.3 F (36.8 C)     Temp Source 06/20/22 1247 Oral     SpO2 06/20/22 1247 96 %     Weight 06/20/22 1248 254 lb (115.2 kg)     Height 06/20/22 1248 '5\' 6"'$  (1.676 m)     Head Circumference --      Peak Flow --      Pain Score 06/20/22 1252 5     Pain Loc --      Pain Edu? --      Excl. in [place]? --     Most recent vital signs: Vitals:   06/20/22 1426 06/20/22 1520  BP: (!) 151/89 (!) 147/93  Pulse: (!) 57 (!) 51  Resp: 18   Temp:    SpO2: 98%      Constitutional: Alert  Eyes: Conjunctivae are normal.  Head: Atraumatic. Nose: No congestion/rhinnorhea. Mouth/Throat: Mucous membranes are moist.   Neck: Painless ROM.  Cardiovascular:   Good peripheral circulation. Respiratory: Normal respiratory effort.  No retractions.  Gastrointestinal: Soft and nontender.  Musculoskeletal:  no deformity Neurologic:  MAE spontaneously. No gross focal neurologic deficits are appreciated.  Skin:  Skin is warm, dry and intact. No rash noted. Psychiatric: Mood and affect are normal. Speech and behavior are normal.    ED Results / Procedures  / Treatments   Labs (all labs ordered are listed, but only abnormal results are displayed) Labs Reviewed  COMPREHENSIVE METABOLIC PANEL - Abnormal; Notable for the following components:      Result Value   Potassium 3.2 (*)    Glucose, Bld 171 (*)    Calcium 8.8 (*)    Anion gap 4 (*)    All other components within normal limits  CBC WITH DIFFERENTIAL/PLATELET  TROPONIN I (HIGH SENSITIVITY)  TROPONIN I (HIGH SENSITIVITY)     EKG  ED ECG REPORT I, Merlyn Lot, the attending physician, personally viewed and interpreted this ECG.   Date: 06/20/2022  EKG Time: 12:58  Rate: 60  Rhythm: sinus  Axis: normal  Intervals: normal  ST&T Change: no stemi, no depressions    RADIOLOGY Please see ED Course for my review and interpretation.  I personally reviewed all radiographic images ordered to evaluate for the above acute complaints and reviewed radiology reports and findings.  These findings were personally discussed with the patient.  Please see medical record for radiology report.    PROCEDURES:  Critical Care performed:  Procedures   MEDICATIONS ORDERED IN ED: Medications  potassium chloride SA (KLOR-CON M) CR tablet 40 mEq (40 mEq Oral Given 06/20/22 1422)  acetaminophen (TYLENOL) tablet 650 mg (650 mg Oral Given 06/20/22 1619)     IMPRESSION / MDM / ASSESSMENT AND PLAN / ED COURSE  I reviewed the triage vital signs and the nursing notes.                              Differential diagnosis includes, but is not limited to, hypertensive urgency, essential hypertension, electrolyte abnormality, CVA, mass, otitis, URI, pneumonia, ACS  Patient presenting to the ER for evaluation of symptoms as described above.  Based on symptoms, risk factors and considered above differential, this presenting complaint could reflect a potentially life-threatening illness therefore the patient will be placed on continuous pulse oximetry and telemetry for monitoring.  Laboratory  evaluation will be sent to evaluate for the above complaints.  Patient well-appearing however.  No focal deficits.  Given his age with history of Raynaud's years with headache CT imaging will be ordered.  His presentation is not consistent with dissection, PE or acute infectious process.  Will observe on monitor.  He is mildly hypertensive but endorses being under quite a bit of stress.  Will observe to see what his blood pressure trends.   Clinical Course as of 06/20/22 1707  Thu Jun 20, 2022  1409 CT head on my review and interpretation does not show any evidence of acute intracranial abnormality no sign of subdural or mass. [PR]  3762 Patient reassessed.  He is feeling well.  Denies any chest pain.  Does endorse recent loss of her friend which has caused significant increased stress for him likely contributing to his elevated blood pressures.  Given his otherwise reassuring workup and well appearance I do think that he will be appropriate for further workup as an outpatient assume that his repeat troponin is negative and he remains pain-free. [PR]  1705 Patient remains well-appearing no acute distress.  I do believe he is stable and appropriate for outpatient follow-up.  Patient and family agree to plan. [PR]    Clinical Course User Index [PR] Merlyn Lot, MD     FINAL CLINICAL IMPRESSION(S) / ED DIAGNOSES   Final diagnoses:  Secondary hypertension  Tinnitus of left ear     Rx / DC Orders   ED Discharge Orders     None        Note:  This document was prepared using Dragon voice recognition software and may include unintentional dictation errors.    Merlyn Lot, MD 06/20/22 [phone]

## 2022-06-20 NOTE — ED Triage Notes (Signed)
Pt sts that he has been having ringing in his ears with chest pain.

## 2022-06-20 NOTE — ED Provider Triage Note (Signed)
Emergency Medicine Provider Triage Evaluation Note  Larry L Marinello Sr. , a 66 y.o. male  was evaluated in triage.  Pt complains of ringing in the ears, chest pain, under a lot of stress, out of his blood pressure meds,.  Review of Systems  Positive:  Negative:   Physical Exam  BP (!) 153/93 (BP Location: Left Arm)   Pulse (!) 59   Temp 98.3 F (36.8 C) (Oral)   Resp 18   Ht '5\' 6"'$  (1.676 m)   Wt 115.2 kg   SpO2 96%   BMI 41.00 kg/m  Gen:   Awake, no distress   Resp:  Normal effort  MSK:   Moves extremities without difficulty  Other:    Medical Decision Making  Medically screening exam initiated at 12:50 PM.  Appropriate orders placed.  Larry L Tyner Sr. was informed that the remainder of the evaluation will be completed by another provider, this initial triage assessment does not replace that evaluation, and the importance of remaining in the ED until their evaluation is complete.     Versie Starks, PA-C 06/20/22 1250

## 2022-06-20 NOTE — ED Notes (Addendum)
Esig pad not working, pt verbalizes understanding of dc instructions. Pt wheeled to lobby.

## 2022-06-27 DIAGNOSIS — E876 Hypokalemia: Secondary | ICD-10-CM | POA: Diagnosis not present

## 2022-06-27 DIAGNOSIS — I10 Essential (primary) hypertension: Secondary | ICD-10-CM | POA: Diagnosis not present

## 2022-06-27 DIAGNOSIS — J3089 Other allergic rhinitis: Secondary | ICD-10-CM | POA: Diagnosis not present

## 2022-06-27 DIAGNOSIS — H8113 Benign paroxysmal vertigo, bilateral: Secondary | ICD-10-CM | POA: Diagnosis not present

## 2022-08-01 DIAGNOSIS — I10 Essential (primary) hypertension: Secondary | ICD-10-CM | POA: Diagnosis not present

## 2022-08-01 DIAGNOSIS — E782 Mixed hyperlipidemia: Secondary | ICD-10-CM | POA: Diagnosis not present

## 2022-08-01 DIAGNOSIS — G4733 Obstructive sleep apnea (adult) (pediatric): Secondary | ICD-10-CM | POA: Diagnosis not present

## 2022-08-01 DIAGNOSIS — M13871 Other specified arthritis, right ankle and foot: Secondary | ICD-10-CM | POA: Diagnosis not present

## 2022-08-01 DIAGNOSIS — R7302 Impaired glucose tolerance (oral): Secondary | ICD-10-CM | POA: Diagnosis not present

## 2022-08-08 ENCOUNTER — Other Ambulatory Visit: Payer: Self-pay

## 2022-08-08 ENCOUNTER — Encounter: Payer: Self-pay | Admitting: Internal Medicine

## 2022-08-08 ENCOUNTER — Ambulatory Visit: Payer: Self-pay | Admitting: Internal Medicine

## 2022-08-08 ENCOUNTER — Ambulatory Visit (INDEPENDENT_AMBULATORY_CARE_PROVIDER_SITE_OTHER): Payer: Medicare HMO | Admitting: Internal Medicine

## 2022-08-08 VITALS — BP 134/78 | HR 67 | Ht 66.0 in | Wt 253.0 lb

## 2022-08-08 DIAGNOSIS — I10 Essential (primary) hypertension: Secondary | ICD-10-CM | POA: Diagnosis not present

## 2022-08-08 DIAGNOSIS — R7302 Impaired glucose tolerance (oral): Secondary | ICD-10-CM | POA: Diagnosis not present

## 2022-08-08 DIAGNOSIS — G4733 Obstructive sleep apnea (adult) (pediatric): Secondary | ICD-10-CM

## 2022-08-08 DIAGNOSIS — E119 Type 2 diabetes mellitus without complications: Secondary | ICD-10-CM | POA: Diagnosis not present

## 2022-08-08 DIAGNOSIS — E782 Mixed hyperlipidemia: Secondary | ICD-10-CM

## 2022-08-08 DIAGNOSIS — M1000 Idiopathic gout, unspecified site: Secondary | ICD-10-CM

## 2022-08-08 DIAGNOSIS — Z6841 Body Mass Index (BMI) 40.0 and over, adult: Secondary | ICD-10-CM | POA: Diagnosis not present

## 2022-08-08 DIAGNOSIS — M109 Gout, unspecified: Secondary | ICD-10-CM | POA: Insufficient documentation

## 2022-08-08 MED ORDER — ALLOPURINOL 300 MG PO TABS: 300.0000 mg | ORAL_TABLET | Freq: Every day | ORAL | 0 refills | Status: AC

## 2022-08-08 NOTE — Progress Notes (Addendum)
     Patient ID: Larry Sutton, male    DOB: [birth year], 67 y.o.   MRN: [id number]  Patient comes in for follow up of Right foot and ankle pain due to gout flare up. Today feels much better- completed Prednisone dose, and on Colchicine. Will get labs today . Also to increase dose of Allopurinol to 300 mg po qd.      Review of Systems  Gastrointestinal:  Negative for abdominal distention, abdominal pain, anal bleeding, constipation, diarrhea, nausea and rectal pain.  Genitourinary:  Negative for decreased urine volume and urgency.  Musculoskeletal:  Negative for joint swelling.  Psychiatric/Behavioral:  Negative for agitation.   All other systems reviewed and are negative.       Physical Exam Vitals and nursing note reviewed.  HENT:     Head: Normocephalic.     Jaw: There is normal jaw occlusion.  Neck:     Thyroid: No thyroid mass or thyroid tenderness.  Cardiovascular:     Rate and Rhythm: Normal rate and regular rhythm.     Pulses: Normal pulses.     Heart sounds: Normal heart sounds.  Pulmonary:     Effort: Pulmonary effort is normal.     Breath sounds: Normal breath sounds.  Abdominal:     General: Abdomen is protuberant. Bowel sounds are normal.  Musculoskeletal:     Cervical back: Full passive range of motion without pain.     Right lower leg: No edema.     Left lower leg: No edema.  Lymphadenopathy:     Cervical: No cervical adenopathy.         Assessment & Plan:   Increase dose of Allopurinol to 300 mg po qd. Check labs today. Continue al other meds.  Larry Sutton was seen today for follow-up.  Diagnoses and all orders for this visit:  Idiopathic gout, unspecified chronicity, unspecified site -     Uric acid -     allopurinol (ZYLOPRIM) 300 MG tablet; Take 1 tablet (300 mg total) by mouth daily.  Impaired glucose tolerance in obese -     HgB A1c  Mixed hyperlipidemia -     Lipid Panel w/o Chol/HDL Ratio  Essential hypertension, benign -     COMPLETE  METABOLIC PANEL WITH GFR  Primary hypertension  OSA on CPAP  Other orders -     Comprehensive metabolic panel -     Specimen status report   Return in about 3 months (around 11/06/2022).  Total time spent: 30 minutes  Perrin Maltese, MD

## 2022-08-09 LAB — HEMOGLOBIN A1C
Est. average glucose Bld gHb Est-mCnc: 114 mg/dL
Hgb A1c MFr Bld: 5.6 % (ref 4.8–5.6)

## 2022-08-09 LAB — LIPID PANEL W/O CHOL/HDL RATIO
Cholesterol, Total: 186 mg/dL (ref 100–199)
HDL: 42 mg/dL (ref >39)
LDL Chol Calc (NIH): 96 mg/dL (ref 0–99)
Triglycerides: 287 mg/dL — ABNORMAL HIGH (ref 0–149)
VLDL Cholesterol Cal: 48 mg/dL — ABNORMAL HIGH (ref 5–40)

## 2022-08-09 LAB — URIC ACID: Uric Acid: 7.8 mg/dL (ref 3.8–8.4)

## 2022-08-13 LAB — COMPREHENSIVE METABOLIC PANEL
ALT: 24 IU/L (ref 0–44)
AST: 20 IU/L (ref 0–40)
Albumin/Globulin Ratio: 1.7 (ref 1.2–2.2)
Albumin: 4.3 g/dL (ref 3.9–4.9)
Alkaline Phosphatase: 57 IU/L (ref 44–121)
BUN/Creatinine Ratio: 18 (ref 10–24)
BUN: 18 mg/dL (ref 8–27)
Bilirubin Total: 0.6 mg/dL (ref 0.0–1.2)
CO2: 23 mmol/L (ref 20–29)
Calcium: 9.2 mg/dL (ref 8.6–10.2)
Chloride: 97 mmol/L (ref 96–106)
Creatinine, Ser: 0.99 mg/dL (ref 0.76–1.27)
Globulin, Total: 2.6 g/dL (ref 1.5–4.5)
Glucose: 96 mg/dL (ref 70–99)
Potassium: 3.7 mmol/L (ref 3.5–5.2)
Sodium: 139 mmol/L (ref 134–144)
Total Protein: 6.9 g/dL (ref 6.0–8.5)
eGFR: 84 mL/min/{1.73_m2} (ref >59)

## 2022-08-13 LAB — SPECIMEN STATUS REPORT

## 2022-08-15 NOTE — Addendum Note (Signed)
Addended by: Perrin Maltese on: 08/15/2022 09:04 AM   Modules accepted: Level of Service

## 2022-11-06 ENCOUNTER — Telehealth: Payer: Self-pay | Admitting: Internal Medicine

## 2022-11-06 NOTE — Telephone Encounter (Signed)
Entered in error

## 2022-11-07 ENCOUNTER — Ambulatory Visit: Payer: Medicare HMO | Admitting: Internal Medicine

## 2022-11-12 ENCOUNTER — Ambulatory Visit: Payer: Medicare HMO | Admitting: Internal Medicine

## 2022-11-29 ENCOUNTER — Other Ambulatory Visit: Payer: Self-pay | Admitting: Internal Medicine

## 2023-02-05 ENCOUNTER — Telehealth: Payer: Self-pay | Admitting: Internal Medicine

## 2023-02-05 NOTE — Telephone Encounter (Signed)
Patient called in c/o right index finger swollen and painful. States he had something similar end of last year but with pus oozing out and NK Rx'd augmentin. Can we send Rx for augmentin?  [facility] - [address]

## 2023-02-06 ENCOUNTER — Other Ambulatory Visit: Payer: Self-pay

## 2023-02-06 MED ORDER — AMOXICILLIN-POT CLAVULANATE 875-125 MG PO TABS: 1.0000 | ORAL_TABLET | Freq: Two times a day (BID) | ORAL | 0 refills | Status: DC

## 2023-02-06 NOTE — Telephone Encounter (Signed)
LVM informing pt Rx was sent to [address].

## 2023-02-12 DIAGNOSIS — L03011 Cellulitis of right finger: Secondary | ICD-10-CM | POA: Diagnosis not present

## 2023-02-12 DIAGNOSIS — Z6841 Body Mass Index (BMI) 40.0 and over, adult: Secondary | ICD-10-CM | POA: Diagnosis not present

## 2023-03-26 ENCOUNTER — Other Ambulatory Visit: Payer: Self-pay | Admitting: Internal Medicine

## 2023-04-24 ENCOUNTER — Ambulatory Visit: Payer: Medicare HMO | Admitting: Cardiology

## 2023-04-24 ENCOUNTER — Encounter: Payer: Self-pay | Admitting: Cardiology

## 2023-04-24 VITALS — BP 130/85 | HR 64 | Ht 66.0 in | Wt 255.4 lb

## 2023-04-24 DIAGNOSIS — J3089 Other allergic rhinitis: Secondary | ICD-10-CM | POA: Diagnosis not present

## 2023-04-24 DIAGNOSIS — J309 Allergic rhinitis, unspecified: Secondary | ICD-10-CM | POA: Insufficient documentation

## 2023-04-24 MED ORDER — FLUTICASONE PROPIONATE 50 MCG/ACT NA SUSP: 2.0000 | Freq: Every day | NASAL | 1 refills | Status: AC

## 2023-04-24 NOTE — Patient Instructions (Signed)
Continue home OTC medication Pick up an antihistamine such as Claritin or Zyrtec Use nasal spray

## 2023-04-24 NOTE — Progress Notes (Signed)
Established Patient Office Visit  Subjective:  Patient ID: Larry Sutton, male    DOB: 1955-09-14  Age: 67 y.o. MRN: [id number]  Chief Complaint  Patient presents with   Acute Visit    Possible sinus infection    Patient in office for an acute visit for a possible sinus infection. Patient reports symptoms started 4 days ago. Complaining of a cough with white sputum, red and itchy eyes, rhinorrhea. Denies sinus pain, PND, sore throat. Patient reports taking an OTC medication, patient unsure of name of medication. Patient states symptoms mostly resolved. Recommend using an antihistamine, nasal spray and current OTC medication he has been using.     No other concerns at this time.   Past Medical History:  Diagnosis Date   Essential hypertension, benign    Gout due to renal impairment, left ankle and foot    HSV-2 infection    Hypertension    Impaired glucose tolerance in obese    Mixed hyperlipidemia    OSA on CPAP    Prostate cancer (HCC)     History reviewed. No pertinent surgical history.  Social History   Socioeconomic History   Marital status: Married    Spouse name: Not on file   Number of children: Not on file   Years of education: Not on file   Highest education level: Not on file  Occupational History   Not on file  Tobacco Use   Smoking status: Never   Smokeless tobacco: Never  Vaping Use   Vaping status: Never Used  Substance and Sexual Activity   Alcohol use: Never   Drug use: Never   Sexual activity: Not on file  Other Topics Concern   Not on file  Social History Narrative   Not on file   Social Determinants of Health   Financial Resource Strain: Not on file  Food Insecurity: Not on file  Transportation Needs: Not on file  Physical Activity: Not on file  Stress: Not on file  Social Connections: Unknown (11/19/2021)   Received from [facility], [facility]   Social Network    Social Network: Not on file  Intimate Partner Violence: Unknown  (10/11/2021)   Received from [facility], [facility]   HITS    Physically Hurt: Not on file    Insult or Talk Down To: Not on file    Threaten Physical Harm: Not on file    Scream or Curse: Not on file    Family History  Problem Relation Age of Onset   Prostate cancer Father     No Known Allergies  Review of Systems  HENT:  Negative for sinus pain and sore throat.   Eyes:  Positive for redness.  Respiratory:  Positive for cough and sputum production.   Cardiovascular: Negative.  Negative for chest pain.  Gastrointestinal: Negative.  Negative for abdominal pain, constipation and diarrhea.  Genitourinary: Negative.   Musculoskeletal:  Negative for joint pain and myalgias.  Skin: Negative.   Neurological: Negative.  Negative for dizziness.  Endo/Heme/Allergies: Negative.   All other systems reviewed and are negative.      Objective:   BP 130/85   Pulse 64   Ht 5\' 6"  (1.676 m)   Wt 255 lb 6.4 oz (115.8 kg)   SpO2 96%   BMI 41.22 kg/m   Vitals:   04/24/23 1346  BP: 130/85  Pulse: 64  Height: 5\' 6"  (1.676 m)  Weight: 255 lb 6.4 oz (115.8 kg)  SpO2:  96%  BMI (Calculated): 41.24    Physical Exam Vitals and nursing note reviewed.  Constitutional:      Appearance: Normal appearance. He is normal weight.  HENT:     Head: Normocephalic and atraumatic.     Nose: Nose normal.     Mouth/Throat:     Mouth: Mucous membranes are moist.     Pharynx: Oropharynx is clear.  Eyes:     Extraocular Movements: Extraocular movements intact.     Conjunctiva/sclera: Conjunctivae normal.     Pupils: Pupils are equal, round, and reactive to light.  Cardiovascular:     Rate and Rhythm: Normal rate and regular rhythm.     Pulses: Normal pulses.     Heart sounds: Normal heart sounds.  Pulmonary:     Effort: Pulmonary effort is normal.     Breath sounds: Normal breath sounds.  Abdominal:     General: Abdomen is flat. Bowel sounds are normal.     Palpations: Abdomen is soft.   Musculoskeletal:        General: Normal range of motion.     Cervical back: Normal range of motion.  Skin:    General: Skin is warm and dry.  Neurological:     General: No focal deficit present.     Mental Status: He is alert and oriented to person, place, and time.  Psychiatric:        Mood and Affect: Mood normal.        Behavior: Behavior normal.        Thought Content: Thought content normal.        Judgment: Judgment normal.      No results found for any visits on 04/24/23.  No results found for this or any previous visit (from the past 2160 hour(s)).    Assessment & Plan:  Recommend using an antihistamine, nasal spray and current OTC medication he has been using.   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   Total time spent: 25 minutes  [employer], NP  04/24/2023   This document may have been prepared by Dragon Voice Recognition software and as such may include unintentional dictation errors.

## 2023-04-28 ENCOUNTER — Telehealth: Payer: Self-pay

## 2023-04-30 ENCOUNTER — Other Ambulatory Visit: Payer: Self-pay | Admitting: Cardiology

## 2023-04-30 MED ORDER — AMOXICILLIN-POT CLAVULANATE 500-125 MG PO TABS
1.0000 | ORAL_TABLET | Freq: Three times a day (TID) | ORAL | 0 refills | Status: AC
Start: 2023-04-30 — End: 2023-05-03

## 2023-05-24 ENCOUNTER — Other Ambulatory Visit: Payer: Self-pay | Admitting: Internal Medicine

## 2023-05-24 DIAGNOSIS — I10 Essential (primary) hypertension: Secondary | ICD-10-CM

## 2023-10-31 ENCOUNTER — Encounter: Payer: Self-pay | Admitting: Internal Medicine

## 2023-10-31 ENCOUNTER — Ambulatory Visit (INDEPENDENT_AMBULATORY_CARE_PROVIDER_SITE_OTHER): Admitting: Internal Medicine

## 2023-10-31 VITALS — BP 128/84 | HR 80 | Ht 66.0 in | Wt 257.4 lb

## 2023-10-31 DIAGNOSIS — R300 Dysuria: Secondary | ICD-10-CM

## 2023-10-31 DIAGNOSIS — M1000 Idiopathic gout, unspecified site: Secondary | ICD-10-CM

## 2023-10-31 DIAGNOSIS — I10 Essential (primary) hypertension: Secondary | ICD-10-CM | POA: Diagnosis not present

## 2023-10-31 DIAGNOSIS — Z1211 Encounter for screening for malignant neoplasm of colon: Secondary | ICD-10-CM

## 2023-10-31 DIAGNOSIS — Z8546 Personal history of malignant neoplasm of prostate: Secondary | ICD-10-CM | POA: Diagnosis not present

## 2023-10-31 DIAGNOSIS — E782 Mixed hyperlipidemia: Secondary | ICD-10-CM | POA: Diagnosis not present

## 2023-10-31 DIAGNOSIS — Z1389 Encounter for screening for other disorder: Secondary | ICD-10-CM

## 2023-10-31 DIAGNOSIS — G4733 Obstructive sleep apnea (adult) (pediatric): Secondary | ICD-10-CM | POA: Diagnosis not present

## 2023-10-31 DIAGNOSIS — R7302 Impaired glucose tolerance (oral): Secondary | ICD-10-CM

## 2023-10-31 DIAGNOSIS — J3089 Other allergic rhinitis: Secondary | ICD-10-CM

## 2023-10-31 LAB — POCT URINALYSIS DIPSTICK
Bilirubin, UA: NEGATIVE
Blood, UA: NEGATIVE
Glucose, UA: NEGATIVE
Ketones, UA: NEGATIVE
Leukocytes, UA: NEGATIVE
Nitrite, UA: NEGATIVE
Protein, UA: POSITIVE — AB
Spec Grav, UA: 1.015 (ref 1.010–1.025)
Urobilinogen, UA: 0.2 U/dL
pH, UA: 5.5 (ref 5.0–8.0)

## 2023-10-31 NOTE — Progress Notes (Signed)
 Established Patient Office Visit  Subjective:  Patient ID: Larry Sutton, male    DOB: 1955/11/05  Age: 68 y.o. MRN: [id number]  Chief Complaint  Patient presents with   Follow-up    Patient comes in for his follow-up today.  He has not been in for a while.  Generally he feels well and does not complain of any headaches or dizziness, no nausea or vomiting, no chest pain and no palpitations.  However he mentions some burning during micturition.  His urine dipstick is unremarkable except for some protein. He denies exposure to STIs or unprotected intercourse. Will send his urine specimen to lab.  Meanwhile encouraged to drink more water. Check blood work today as well. 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  Advised to try otc Motrin, and try a wrist splint - until follow up.    No other concerns at this time.   Past Medical History:  Diagnosis Date   Essential hypertension, benign    Gout due to renal impairment, left ankle and foot    HSV-2 infection    Hypertension    Impaired glucose tolerance in obese    Mixed hyperlipidemia    OSA on CPAP    Prostate cancer (HCC)     History reviewed. No pertinent surgical history.  Social History   Socioeconomic History   Marital status: Married    Spouse name: Not on file   Number of children: Not on file   Years of education: Not on file   Highest education level: Not on file  Occupational History   Not on file  Tobacco Use   Smoking status: Never   Smokeless tobacco: Never  Vaping Use   Vaping status: Never Used  Substance and Sexual Activity   Alcohol use: Never   Drug use: Never   Sexual activity: Not on file  Other Topics Concern   Not on file  Social History Narrative   Not on file   Social Drivers of Health   Financial Resource Strain: Not on file  Food Insecurity: Not on file  Transportation Needs: Not on file  Physical Activity: Not on file  Stress: Not on file   Social Connections: Unknown (11/19/2021)   Received from [facility], [facility]   Social Network    Social Network: Not on file  Intimate Partner Violence: Unknown (10/11/2021)   Received from [facility], [facility]   HITS    Physically Hurt: Not on file    Insult or Talk Down To: Not on file    Threaten Physical Harm: Not on file    Scream or Curse: Not on file    Family History  Problem Relation Age of Onset   Prostate cancer Father     No Known Allergies  Outpatient Medications Prior to Visit  Medication Sig   allopurinol  (ZYLOPRIM ) 300 MG tablet Take 1 tablet (300 mg total) by mouth daily.   chlorthalidone (HYGROTON) 25 MG tablet Take 1 tablet by mouth once daily for blood pressure   fluticasone  (FLONASE ) 50 MCG/ACT nasal spray Place 2 sprays into both nostrils daily.   losartan (COZAAR) 50 MG tablet TAKE 1 TABLET BY MOUTH IN THE MORNING FOR BLOOD PRESSURE   rosuvastatin (CRESTOR) 10 MG tablet Take 10 mg by mouth daily.   colchicine 0.6 MG tablet Take 0.6 mg by mouth 3 (three) times daily. (Patient not taking: Reported on 10/31/2023)   potassium chloride  (KLOR-CON  M) 10  MEQ tablet Take 10 mEq by mouth once. (Patient not taking: Reported on 10/31/2023)   No facility-administered medications prior to visit.    Review of Systems  Constitutional: Negative.   HENT: Negative.  Negative for sinus pain and sore throat.   Eyes: Negative.   Respiratory: Negative.  Negative for cough and shortness of breath.   Cardiovascular: Negative.  Negative for chest pain, palpitations and leg swelling.  Gastrointestinal: Negative.  Negative for abdominal pain, constipation, diarrhea, heartburn, nausea and vomiting.  Genitourinary:  Positive for dysuria. Negative for flank pain, frequency, hematuria and urgency.  Musculoskeletal: Negative.  Negative for back pain, joint pain and myalgias.  Skin: Negative.   Neurological:  Positive for sensory change (right hand numbness  intermittent). Negative for dizziness, tingling, tremors and headaches.  Endo/Heme/Allergies: Negative.   Psychiatric/Behavioral: Negative.  Negative for depression and suicidal ideas. The patient is not nervous/anxious.        Objective:   BP 128/84   Pulse 80   Ht 5\' 6"  (1.676 m)   Wt 257 lb 6.4 oz (116.8 kg)   SpO2 99%   BMI 41.55 kg/m   Vitals:   10/31/23 1043  BP: 128/84  Pulse: 80  Height: 5\' 6"  (1.676 m)  Weight: 257 lb 6.4 oz (116.8 kg)  SpO2: 99%  BMI (Calculated): 41.57    Physical Exam Vitals and nursing note reviewed.  Constitutional:      General: He is not in acute distress.    Appearance: Normal appearance.  HENT:     Head: Normocephalic and atraumatic.     Nose: Nose normal.     Mouth/Throat:     Mouth: Mucous membranes are moist.     Pharynx: Oropharynx is clear.  Eyes:     Conjunctiva/sclera: Conjunctivae normal.     Pupils: Pupils are equal, round, and reactive to light.  Cardiovascular:     Rate and Rhythm: Normal rate and regular rhythm.     Pulses: Normal pulses.     Heart sounds: Normal heart sounds.  Pulmonary:     Effort: Pulmonary effort is normal.     Breath sounds: Normal breath sounds. No wheezing, rhonchi or rales.  Abdominal:     General: Bowel sounds are normal.     Palpations: Abdomen is soft. There is no mass.     Tenderness: There is no abdominal tenderness. There is no right CVA tenderness, left CVA tenderness, guarding or rebound.     Hernia: No hernia is present.  Musculoskeletal:        General: Normal range of motion.     Cervical back: Normal range of motion.     Right lower leg: No edema.     Left lower leg: No edema.  Skin:    General: Skin is warm and dry.     Findings: No lesion or rash.  Neurological:     General: No focal deficit present.     Mental Status: He is alert and oriented to person, place, and time.  Psychiatric:        Mood and Affect: Mood normal.        Behavior: Behavior normal.         Judgment: Judgment normal.      Results for orders placed or performed in visit on 10/31/23  POCT Urinalysis Dipstick (81002)  Result Value Ref Range   Color, UA Yellow    Clarity, UA Clear    Glucose, UA Negative Negative   Bilirubin, UA  Negative    Ketones, UA Negative    Spec Grav, UA 1.015 1.010 - 1.025   Blood, UA Negative    pH, UA 5.5 5.0 - 8.0   Protein, UA Positive (A) Negative   Urobilinogen, UA 0.2 0.2 or 1.0 E.U./dL   Nitrite, UA Negative    Leukocytes, UA Negative Negative   Appearance Clear    Odor Yes     Recent Results (from the past 2160 hours)  POCT Urinalysis Dipstick (04540)     Status: Abnormal   Collection Time: 10/31/23 11:08 AM  Result Value Ref Range   Color, UA Yellow    Clarity, UA Clear    Glucose, UA Negative Negative   Bilirubin, UA Negative    Ketones, UA Negative    Spec Grav, UA 1.015 1.010 - 1.025   Blood, UA Negative    pH, UA 5.5 5.0 - 8.0   Protein, UA Positive (A) Negative   Urobilinogen, UA 0.2 0.2 or 1.0 E.U./dL   Nitrite, UA Negative    Leukocytes, UA Negative Negative   Appearance Clear    Odor Yes       Assessment & Plan:  Check labs today.  Encouraged to drink more water.  Right wrist splint and otc NSAIDs. Problem List Items Addressed This Visit     Gout - Primary (Chronic)   Relevant Orders   Uric acid   Mixed hyperlipidemia (Chronic)   Relevant Orders   Lipid Panel w/o Chol/HDL Ratio   Impaired glucose tolerance in obese (Chronic)   Relevant Orders   Hemoglobin A1c   OSA on CPAP (Chronic)   Allergic rhinitis due to allergen   Relevant Orders   CBC with Diff   Other Visit Diagnoses       Essential hypertension, benign       Relevant Orders   CMP14+EGFR     History of prostate cancer       Relevant Orders   PSA     Colon cancer screening       Relevant Orders   Ambulatory referral to Gastroenterology     Screening for blood or protein in urine       Relevant Orders   POCT Urinalysis Dipstick  (98119) (Completed)     Dysuria       Relevant Orders   Urine Culture       Return in about 10 days (around 11/10/2023).   Total time spent: 30 minutes  Aisha Hove, MD  10/31/2023   This document may have been prepared by [facility] Voice Recognition software and as such may include unintentional dictation errors.

## 2023-11-01 LAB — CMP14+EGFR
ALT: 17 IU/L (ref 0–44)
AST: 28 IU/L (ref 0–40)
Albumin: 4.4 g/dL (ref 3.9–4.9)
Alkaline Phosphatase: 72 IU/L (ref 44–121)
BUN/Creatinine Ratio: 9 — ABNORMAL LOW (ref 10–24)
BUN: 9 mg/dL (ref 8–27)
Bilirubin Total: 0.9 mg/dL (ref 0.0–1.2)
CO2: 22 mmol/L (ref 20–29)
Calcium: 9.3 mg/dL (ref 8.6–10.2)
Chloride: 104 mmol/L (ref 96–106)
Creatinine, Ser: 0.98 mg/dL (ref 0.76–1.27)
Globulin, Total: 2.3 g/dL (ref 1.5–4.5)
Glucose: 117 mg/dL — ABNORMAL HIGH (ref 70–99)
Potassium: 3.9 mmol/L (ref 3.5–5.2)
Sodium: 141 mmol/L (ref 134–144)
Total Protein: 6.7 g/dL (ref 6.0–8.5)
eGFR: 84 mL/min/{1.73_m2} (ref >59)

## 2023-11-01 LAB — CBC WITH DIFFERENTIAL/PLATELET
Basophils Absolute: 0 10*3/uL (ref 0.0–0.2)
Basos: 0 %
EOS (ABSOLUTE): 0.1 10*3/uL (ref 0.0–0.4)
Eos: 1 %
Hematocrit: 47.4 % (ref 37.5–51.0)
Hemoglobin: 15.5 g/dL (ref 13.0–17.7)
Immature Grans (Abs): 0 10*3/uL (ref 0.0–0.1)
Immature Granulocytes: 0 %
Lymphocytes Absolute: 2.2 10*3/uL (ref 0.7–3.1)
Lymphs: 34 %
MCH: 29.5 pg (ref 26.6–33.0)
MCHC: 32.7 g/dL (ref 31.5–35.7)
MCV: 90 fL (ref 79–97)
Monocytes Absolute: 0.6 10*3/uL (ref 0.1–0.9)
Monocytes: 9 %
Neutrophils Absolute: 3.5 10*3/uL (ref 1.4–7.0)
Neutrophils: 56 %
Platelets: 241 10*3/uL (ref 150–450)
RBC: 5.26 x10E6/uL (ref 4.14–5.80)
RDW: 13.2 % (ref 11.6–15.4)
WBC: 6.5 10*3/uL (ref 3.4–10.8)

## 2023-11-01 LAB — LIPID PANEL W/O CHOL/HDL RATIO
Cholesterol, Total: 166 mg/dL (ref 100–199)
HDL: 41 mg/dL (ref >39)
LDL Chol Calc (NIH): 111 mg/dL — ABNORMAL HIGH (ref 0–99)
Triglycerides: 73 mg/dL (ref 0–149)
VLDL Cholesterol Cal: 14 mg/dL (ref 5–40)

## 2023-11-01 LAB — HEMOGLOBIN A1C
Est. average glucose Bld gHb Est-mCnc: 105 mg/dL
Hgb A1c MFr Bld: 5.3 % (ref 4.8–5.6)

## 2023-11-01 LAB — URIC ACID: Uric Acid: 6.9 mg/dL (ref 3.8–8.4)

## 2023-11-01 LAB — PSA: Prostate Specific Ag, Serum: <0.1 ng/mL (ref 0.0–4.0)

## 2023-11-03 LAB — URINE CULTURE

## 2023-11-10 ENCOUNTER — Ambulatory Visit: Admitting: Internal Medicine

## 2024-03-16 ENCOUNTER — Other Ambulatory Visit: Payer: Self-pay | Admitting: Internal Medicine

## 2024-04-20 DIAGNOSIS — S39012A Strain of muscle, fascia and tendon of lower back, initial encounter: Secondary | ICD-10-CM | POA: Diagnosis not present

## 2024-05-29 ENCOUNTER — Other Ambulatory Visit: Payer: Self-pay | Admitting: Internal Medicine
